# Patient Record
Sex: Female | Born: 1970 | Race: White | Hispanic: No | Smoking: Current every day smoker
Health system: Southern US, Community
[De-identification: ages and names within clinical notes are randomized; demographics above are authoritative.]

---

## 1999-02-13 ENCOUNTER — Other Ambulatory Visit: Admission: RE | Admit: 1999-02-13 | Discharge: 1999-02-13 | Payer: Self-pay | Admitting: *Deleted

## 2000-03-04 ENCOUNTER — Other Ambulatory Visit: Admission: RE | Admit: 2000-03-04 | Discharge: 2000-03-04 | Payer: Self-pay | Admitting: *Deleted

## 2001-04-18 ENCOUNTER — Other Ambulatory Visit: Admission: RE | Admit: 2001-04-18 | Discharge: 2001-04-18 | Payer: Self-pay | Admitting: Obstetrics and Gynecology

## 2002-05-14 ENCOUNTER — Other Ambulatory Visit: Admission: RE | Admit: 2002-05-14 | Discharge: 2002-05-14 | Payer: Self-pay | Admitting: *Deleted

## 2003-09-14 ENCOUNTER — Other Ambulatory Visit: Admission: RE | Admit: 2003-09-14 | Discharge: 2003-09-14 | Payer: Self-pay | Admitting: Obstetrics & Gynecology

## 2004-01-03 ENCOUNTER — Ambulatory Visit (HOSPITAL_COMMUNITY): Admission: RE | Admit: 2004-01-03 | Discharge: 2004-01-03 | Payer: Self-pay | Admitting: Family Medicine

## 2004-12-29 ENCOUNTER — Other Ambulatory Visit: Admission: RE | Admit: 2004-12-29 | Discharge: 2004-12-29 | Payer: Self-pay | Admitting: Obstetrics & Gynecology

## 2008-03-10 ENCOUNTER — Emergency Department (HOSPITAL_COMMUNITY): Admission: EM | Admit: 2008-03-10 | Discharge: 2008-03-10 | Payer: Self-pay | Admitting: Emergency Medicine

## 2010-04-23 ENCOUNTER — Encounter: Payer: Self-pay | Admitting: Internal Medicine

## 2013-04-11 ENCOUNTER — Emergency Department (HOSPITAL_COMMUNITY)
Admission: EM | Admit: 2013-04-11 | Discharge: 2013-04-11 | Disposition: A | Discharge status: Home / Self Care | Payer: 59 | Attending: Emergency Medicine | Admitting: Emergency Medicine

## 2013-04-11 ENCOUNTER — Encounter (HOSPITAL_COMMUNITY): Payer: Self-pay | Admitting: Emergency Medicine

## 2013-04-11 ENCOUNTER — Emergency Department (HOSPITAL_COMMUNITY): Payer: 59

## 2013-04-11 DIAGNOSIS — S0003XA Contusion of scalp, initial encounter: Secondary | ICD-10-CM | POA: Insufficient documentation

## 2013-04-11 DIAGNOSIS — Z3202 Encounter for pregnancy test, result negative: Secondary | ICD-10-CM | POA: Insufficient documentation

## 2013-04-11 DIAGNOSIS — W1809XA Striking against other object with subsequent fall, initial encounter: Secondary | ICD-10-CM | POA: Insufficient documentation

## 2013-04-11 DIAGNOSIS — W010XXA Fall on same level from slipping, tripping and stumbling without subsequent striking against object, initial encounter: Secondary | ICD-10-CM | POA: Insufficient documentation

## 2013-04-11 DIAGNOSIS — S1093XA Contusion of unspecified part of neck, initial encounter: Secondary | ICD-10-CM

## 2013-04-11 DIAGNOSIS — Z79899 Other long term (current) drug therapy: Secondary | ICD-10-CM | POA: Insufficient documentation

## 2013-04-11 DIAGNOSIS — S01511A Laceration without foreign body of lip, initial encounter: Secondary | ICD-10-CM

## 2013-04-11 DIAGNOSIS — S0083XA Contusion of other part of head, initial encounter: Secondary | ICD-10-CM

## 2013-04-11 DIAGNOSIS — Y92009 Unspecified place in unspecified non-institutional (private) residence as the place of occurrence of the external cause: Secondary | ICD-10-CM | POA: Insufficient documentation

## 2013-04-11 DIAGNOSIS — Y9389 Activity, other specified: Secondary | ICD-10-CM | POA: Insufficient documentation

## 2013-04-11 DIAGNOSIS — S01501A Unspecified open wound of lip, initial encounter: Secondary | ICD-10-CM | POA: Insufficient documentation

## 2013-04-11 LAB — POCT PREGNANCY, URINE: Preg Test, Ur: NEGATIVE

## 2013-04-11 MED ORDER — MORPHINE SULFATE 4 MG/ML IJ SOLN
4.0000 mg | Freq: Once | INTRAMUSCULAR | Status: AC
  Administered 2013-04-11: 4 mg via INTRAVENOUS
  Filled 2013-04-11: qty 1

## 2013-04-11 NOTE — ED Provider Notes (Addendum)
CSN: [id number]     Arrival date & time 04/11/13  1306 History   First MD Initiated Contact with Patient 04/11/13 1319     Chief Complaint  Patient presents with  . Fall  . Lip Laceration   (Consider location/radiation/quality/duration/timing/severity/associated sxs/prior Treatment) HPI Comments: Patient is a 43 year old female presenting to the emergency department after a fall this morning around 6 AM. Patient states she was getting up when she tripped over her 2-month-old puppy and landed face first onto the table. Patient denies losing consciousness or vomiting. She is describing 7/10 throbbing pain with swelling to the left side of her face. She does endorse that she had a nosebleed that has stopped. She is also complaining of 2 chipped teeth and a lower lip laceration. Patient states she was initially over at urgent care sooner for evaluation where they sent her over here for further evaluation and likely ENT consultation. No modifying factors. Patient is not on any blood thinners. Tetanus UTD.  Patient is a 43 y.o. female presenting with fall.  Fall Pertinent negatives include no chest pain or fever.    History reviewed. No pertinent past medical history. No past surgical history on file. No family history on file. History  Substance Use Topics  . Smoking status: Not on file  . Smokeless tobacco: Not on file  . Alcohol Use: Not on file   OB History   Grav Para Term Preterm Abortions TAB SAB Ect Mult Living                 Review of Systems  Constitutional: Negative for fever.  HENT: Positive for facial swelling.   Respiratory: Negative for shortness of breath.   Cardiovascular: Negative for chest pain.  Skin: Positive for color change (Bruising) and wound (Lip laceration).  Neurological: Negative for syncope.  All other systems reviewed and are negative.    Allergies  Review of patient's allergies indicates no known allergies.  Home Medications   Current  Outpatient Rx  Name  Route  Sig  Dispense  Refill  . ALPRAZolam (XANAX) 0.5 MG tablet   Oral   Take 0.5 mg by mouth daily as needed for anxiety.         Marland Kitchen levonorgestrel (MIRENA) 20 MCG/24HR IUD   Intrauterine   1 each by Intrauterine route once.         . phentermine 15 MG capsule   Oral   Take 15 mg by mouth daily.          BP 121/81  Pulse 81  Temp(Src) 98.4 F (36.9 C) (Oral)  Resp 18  SpO2 99% Physical Exam  Constitutional: She is oriented to person, place, and time. She appears well-developed and well-nourished. No distress.  HENT:  Head: Normocephalic. Head is with contusion. Head is without raccoon's eyes, without Battle's sign, without right periorbital erythema and without left periorbital erythema.    Right Ear: External ear normal.  Left Ear: External ear normal.  Nose: Sinus tenderness present.  Mouth/Throat: Uvula is midline, oropharynx is clear and moist and mucous membranes are normal. No trismus in the jaw. Lacerations present. No uvula swelling. No oropharyngeal exudate.  Dried blood in left nostril. No active bleeding. 2 cm full thickness lip laceration with involvement through vermillion border with tissue loss.   Eyes: Conjunctivae and EOM are normal. Pupils are equal, round, and reactive to light.  Neck: Normal range of motion. Neck supple.  Cardiovascular: Normal rate, regular rhythm, normal heart sounds and  intact distal pulses.   Pulmonary/Chest: Effort normal and breath sounds normal. No respiratory distress.  Abdominal: Soft. There is no tenderness.  Musculoskeletal: Normal range of motion.  Neurological: She is alert and oriented to person, place, and time. She has normal strength. No cranial nerve deficit or sensory deficit. Gait normal. GCS eye subscore is 4. GCS verbal subscore is 5. GCS motor subscore is 6.  No pronator drift. Bilateral heel-knee-shin intact.  Skin: Skin is warm and dry. She is not diaphoretic.  Psychiatric: She has a  normal mood and affect.    ED Course  Procedures (including critical care time) Medications  morphine 4 MG/ML injection 4 mg (not administered)  morphine 4 MG/ML injection 4 mg (4 mg Intravenous Given 04/11/13 1353)    Labs Review Labs Reviewed  POCT PREGNANCY, URINE   Imaging Review Ct Maxillofacial Wo Cm  04/11/2013   CLINICAL DATA:  [age] year old female with facial injury and pain following fall  EXAM: CT MAXILLOFACIAL WITHOUT CONTRAST  TECHNIQUE: Multidetector CT imaging of the maxillofacial structures was performed. Multiplanar CT image reconstructions were also generated. A small metallic BB was placed on the right temple in order to reliably differentiate right from left.  COMPARISON:  None.  FINDINGS: A laceration of the left lower lip is noted.  There is no evidence of fracture, subluxation or dislocation.  The orbits and globes are unremarkable.  The paranasal sinuses are clear except for small amount of mucus in the left maxillary sinus.  The mastoid air cells and middle/inner ears are clear.  No focal bony lesions are present.  The visualized portions of the brain are unremarkable.  IMPRESSION: Lower lip laceration without acute bony abnormality.   Electronically Signed   By: Laveda AbbeJeff  Hu M.D.   On: 04/11/2013 14:22    EKG Interpretation   None       MDM   1. Laceration of lip, complicated, initial encounter     3:44 PM Trauma ENT consulted and will see patient for full thickness lip laceration.  Afebrile, NAD, non-toxic appearing, AAOx4. No neurofocal deficits on examination. CT scan reviewed. Pain managed in ED. Patient awaiting ENT consultation pending disposition. Will sign patient out to Dr. Bernette MayersSheldon.       Jeannetta EllisJennifer L Slyvia Lartigue, PA-C 04/11/13 1622  Lise AuerJennifer L Sincerity Cedar, PA-C 04/11/13 [phone]

## 2013-04-11 NOTE — Discharge Instructions (Signed)
Soft diet as tolerated. Rinse after each meal, ok to brush teeth. Light Activities and ice to area. Apply vaseline or antibiotic ointment twice daily. Ok to shower. F/u 7-10 days.   Absorbable Suture Repair Absorbable sutures (stitches) hold skin together so you can heal. Keep skin wounds clean and dry for the next 2 to 3 days. Then, you may gently wash your wound and dress it with an antibiotic ointment as recommended. As your wound begins to heal, the sutures are no longer needed, and they typically begin to fall off. This will take 7 to 10 days. After 10 days, if your sutures are loose, you can remove them by wiping with a clean gauze pad or a cotton ball. Do not pull your sutures out. They should wipe away easily. If after 10 days they do not easily wipe away, have your caregiver take them out. Absorbable sutures may be used deep in a wound to help hold it together. If these stitches are below the skin, the body will absorb them completely in 3 to 4 weeks.  You may need a tetanus shot if:  You cannot remember when you had your last tetanus shot.  You have never had a tetanus shot. If you get a tetanus shot, your arm may swell, get red, and feel warm to the touch. This is common and not a problem. If you need a tetanus shot and you choose not to have one, there is a rare chance of getting tetanus. Sickness from tetanus can be serious. SEEK IMMEDIATE MEDICAL CARE IF:  You have redness in the wound area.  The wound area feels hot to the touch.  You develop swelling in the wound area.  You develop pain.  There is fluid drainage from the wound. Document Released: 04/26/2004 Document Revised: 06/11/2011 Document Reviewed: 08/08/2010 [facility] Patient Information 2014 [place], [place].

## 2013-04-11 NOTE — Consult Note (Signed)
Reason for Consult:lip laceration Referring Physician: Florestine Avers Wickline MD  [facility]  Catherine Wall is an 43 y.o. female.  HPI: Suffered fall from standing early this am with lip laceration. No additional complaints.   History reviewed. No pertinent past medical history.  No past surgical history on file.  No family history on file.  Social History: + tobacco daily smoking  Allergies: No Known Allergies  Medications: I have reviewed the patient's current medications.  Results for orders placed during the hospital encounter of 04/11/13 (from the past 48 hour(s))  POCT PREGNANCY, URINE     Status: None   Collection Time    04/11/13  2:20 PM      Result Value Range   Preg Test, Ur NEGATIVE  NEGATIVE   Comment:            THE SENSITIVITY OF THIS     METHODOLOGY IS >24 mIU/mL    Ct Maxillofacial Wo Cm  04/11/2013   CLINICAL DATA:  43 year old female with facial injury and pain following fall  EXAM: CT MAXILLOFACIAL WITHOUT CONTRAST  TECHNIQUE: Multidetector CT imaging of the maxillofacial structures was performed. Multiplanar CT image reconstructions were also generated. A small metallic BB was placed on the right temple in order to reliably differentiate right from left.  COMPARISON:  None.  FINDINGS: A laceration of the left lower lip is noted.  There is no evidence of fracture, subluxation or dislocation.  The orbits and globes are unremarkable.  The paranasal sinuses are clear except for small amount of mucus in the left maxillary sinus.  The mastoid air cells and middle/inner ears are clear.  No focal bony lesions are present.  The visualized portions of the brain are unremarkable.  IMPRESSION: Lower lip laceration without acute bony abnormality.   Electronically Signed   By: Laveda AbbeJeff  Hu M.D.   On: 04/11/2013 14:22    ROS Blood pressure 121/81, pulse 81, temperature 98.4 F (36.9 C), temperature source Oral, resp. rate 18, SpO2 99.00%. Physical Exam Alert  oriented, NAD CN II-XII intact, symmetric No septal hematoma EOMI Left lower lip not involving commissure full thickness laceration  Assessment/Plan: CT personally reviewed. No fractures. Lower lip laceration full thickness. Plan repair in ED.  Soft diet as tolerated. Rinse after each meal, ok to brush teeth. Light  Activities and ice to area. Apply vaseline or antibiotic ointment twice daily. Ok to shower. F/u 7-10 days. Contact information provided.  PROCEDURE NOTE: Diagnosis: laceration full thickness left lower lip Procedure:  Repair lower lip full thickness, less than 1/2 vertical height lip  Left mental nerve block performed with 1% lidocaine with epi 1:100K. Prepped with Betadine. Sharp excision of all wound edges completed with scissors. 4-0 vicryl used to repair muscle in interrupted fashion. Vermillion aligned with simple 5- chromic. Intraoral mucosa repaired with running 4-0 vicryl. Wet-dry vermillion approximated with 5-0 chromic. Remainder of vermillion repaired with 5-0 chromic. Tolerated well.  Catherine FellowsBrinda Eitan Doubleday, MD [facility] Plastic [facility] Re[facility] [phone]

## 2013-04-11 NOTE — ED Notes (Signed)
Patient transported to CT 

## 2013-04-11 NOTE — ED Notes (Signed)
MD at bedside. 

## 2013-04-11 NOTE — ED Notes (Signed)
Dr. Wickline at bedside.  

## 2013-04-11 NOTE — ED Notes (Signed)
Pt from home, c/o fall with lip laceration. Bleeding controled pta. Pt has swelling to left side of face, dried blood at left nostril. Laceration is full thickness. Pt alert and oriented, denies loc.

## 2013-04-11 NOTE — ED Provider Notes (Signed)
Medical screening examination/treatment/procedure(s) were conducted as a shared visit with non-physician practitioner(s) and myself.  I personally evaluated the patient during the encounter.  EKG Interpretation   None        Pt with large lip laceration s/p fall ENT has been consulted Pt stable in the ED   Joya Gaskinsonald W Genoa Freyre, MD 04/11/13 1627

## 2013-04-11 NOTE — ED Provider Notes (Signed)
  Physical Exam  BP 121/81  Pulse 81  Temp(Src) 98.4 F (36.9 C) (Oral)  Resp 18  SpO2 99%  Physical Exam  ED Course  Procedures  MDM  Tripped over dog earlier today, struck her face on the kitchen table, now with bruising on L cheek, 2-3 cm laceration on lip. CT face sig only for contusion, lip laceration. ENT contacted, will evaluate, will fix lip laceration. Tetanus updated. Disposition pending ENT.   ENT evaluated, sutured patient, will follow-up in 7-10 days. Patient discharged with laceration care instructions. Will follow-up with [facility] as needed. Patient seen and evaluated by myself and my attending, Dr. Bernette MayersSheldon.        Imagene ShellerSteve Chandlar Guice, MD 04/12/13 [phone]

## 2013-04-12 NOTE — ED Provider Notes (Signed)
Medical screening examination/treatment/procedure(s) were conducted as a shared visit with non-physician practitioner(s) and myself.  I personally evaluated the patient during the encounter.  EKG Interpretation   None        Pt stable in the ED, large lip laceration noted, ENT consulted  Joya Gaskinsonald W Azarel Banner, MD 04/12/13 [phone]

## 2014-08-23 IMAGING — CT CT MAXILLOFACIAL W/O CM
3 series · 16 of 47 positions shown, 19 images · non-contrast
Comparison: None.

CLINICAL DATA: 42-year-old female with facial injury and pain
following fall

EXAM:
CT MAXILLOFACIAL WITHOUT CONTRAST
TECHNIQUE: Multidetector CT imaging of the maxillofacial structures was
performed. Multiplanar CT image reconstructions were also generated.
A small metallic BB was placed on the right temple in order to
reliably differentiate right from left.

[Series 2: facial/ orbits 2.0 h30s · axial · 0.34mm/px · z∈[+292,+434]mm · 10 of 83 slices shown, 13 images]
[im 6/83  brain]
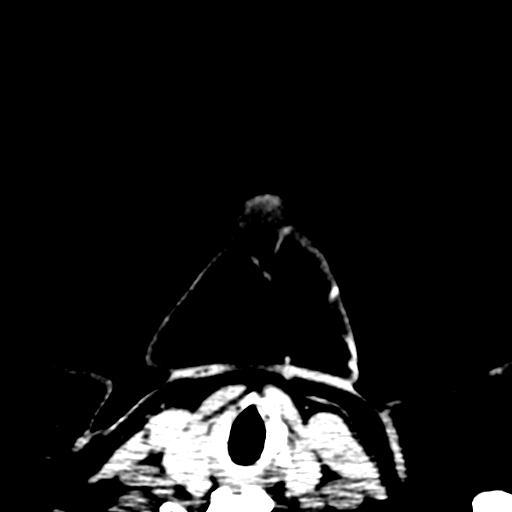
[im 6/83  bone]
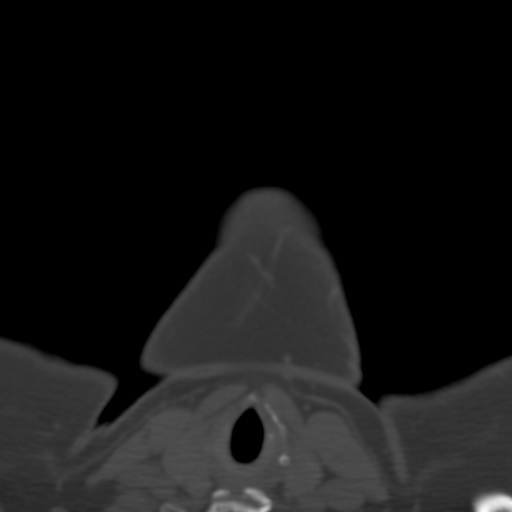
[im 15/83  bone]
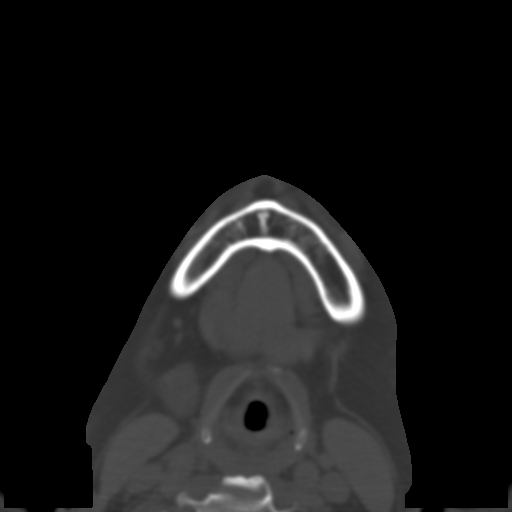
[im 23/83  bone]
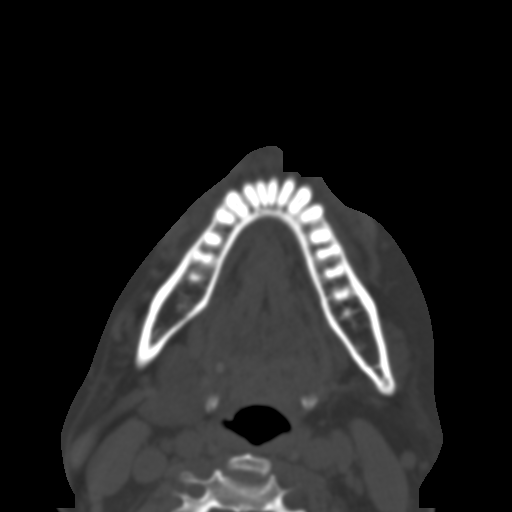
[im 29/83  bone]
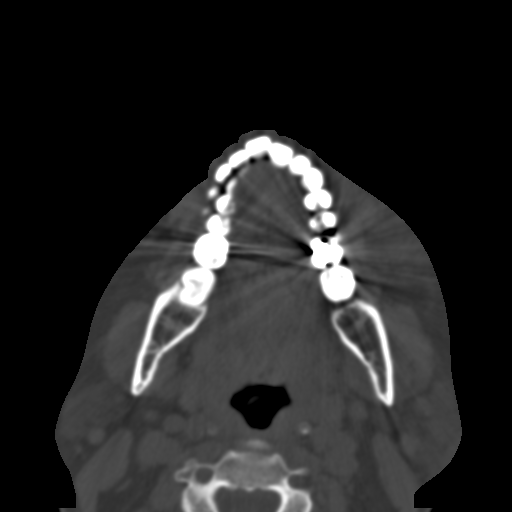
[im 37/83  brain]
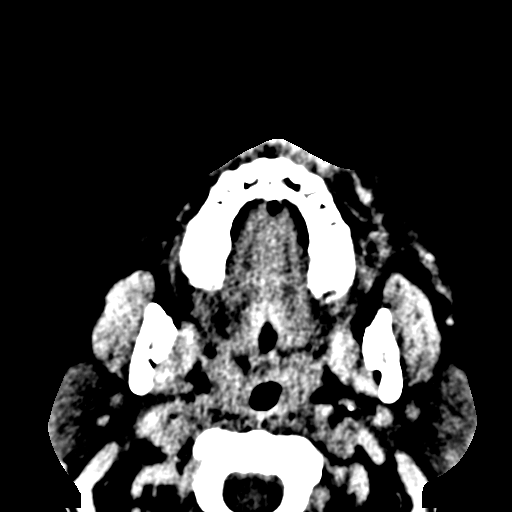
[im 37/83  bone]
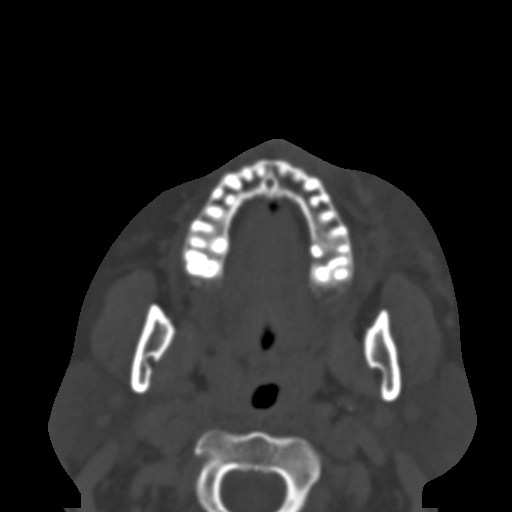
[im 46/83  bone]
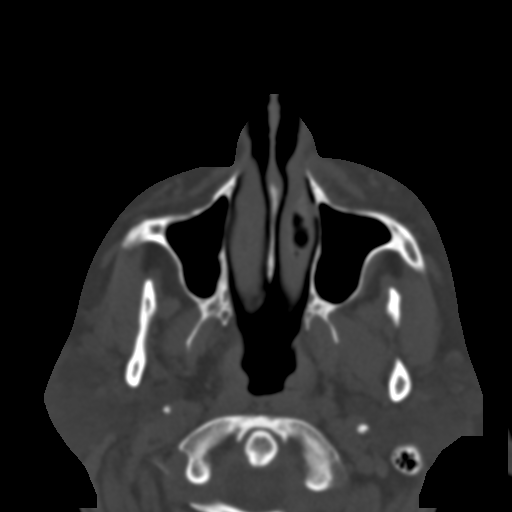
[im 54/83  bone]
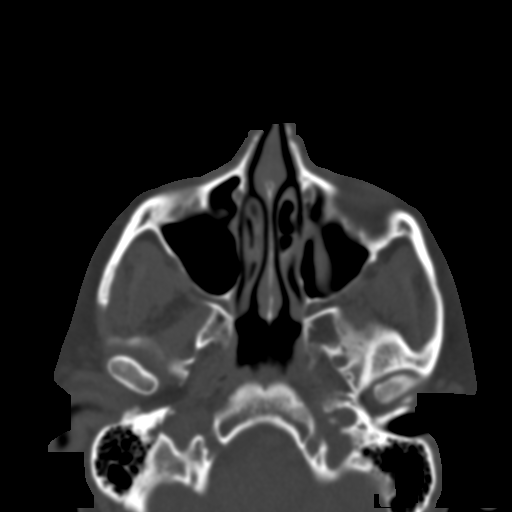
[im 63/83  bone]
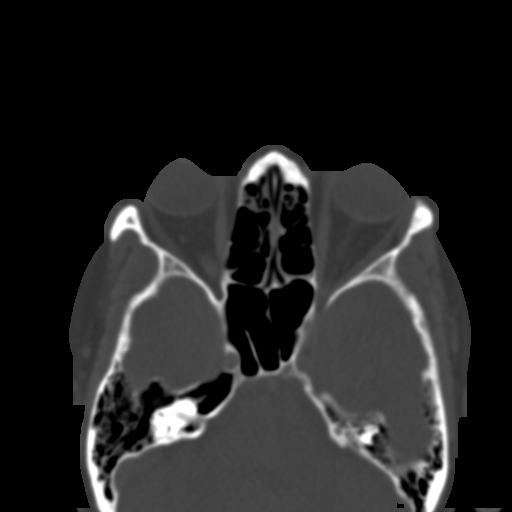
[im 68/83  brain]
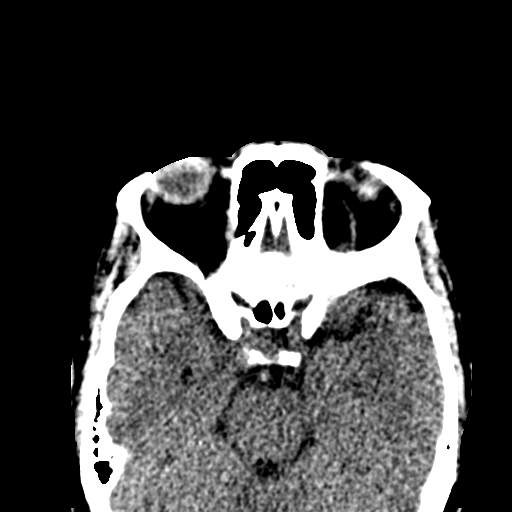
[im 68/83  bone]
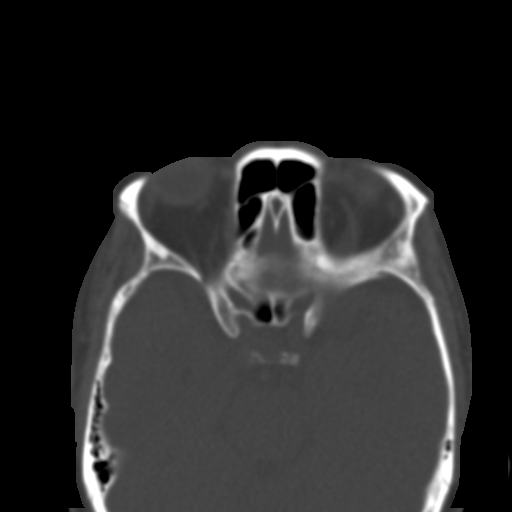
[im 77/83  bone]
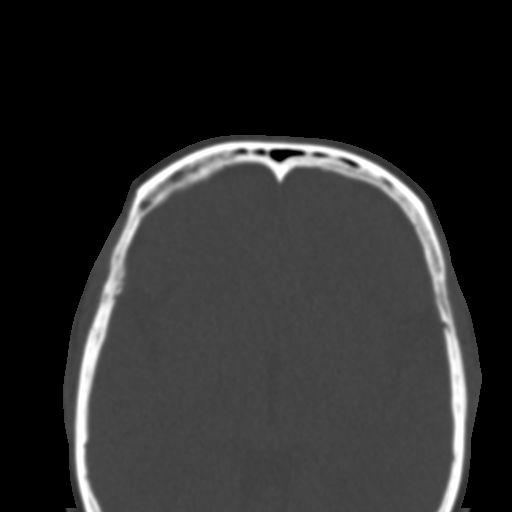

[Series 4: coronal st · coronal · 0.32mm/px · 3 of 71 slices shown]
[im 24/71  bone]
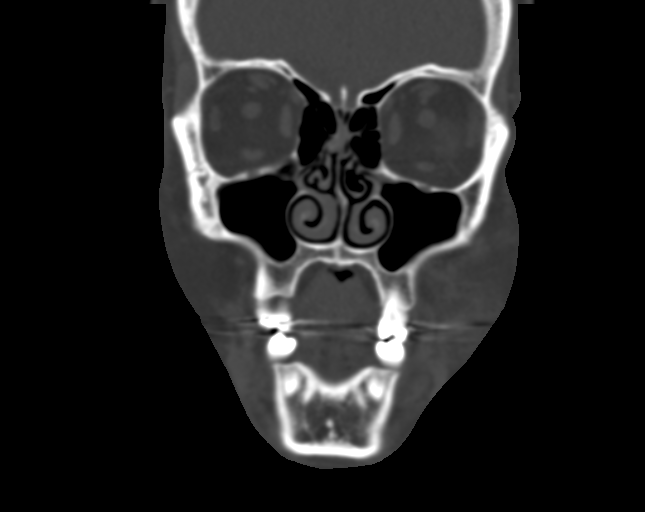
[im 32/71  bone]
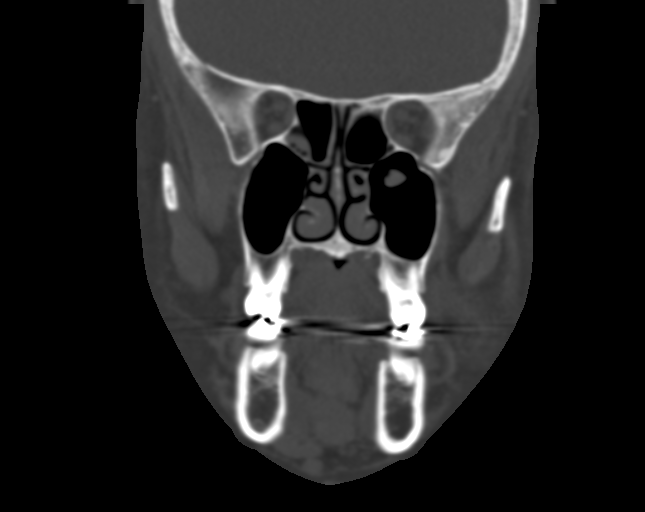
[im 39/71  bone]
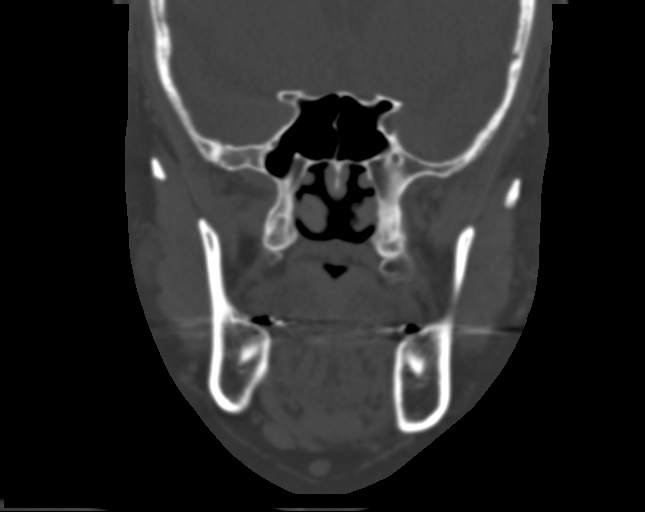

[Series 5: sagittal st · sagittal · 0.32mm/px · 3 of 76 slices shown]
[im 26/76  bone]
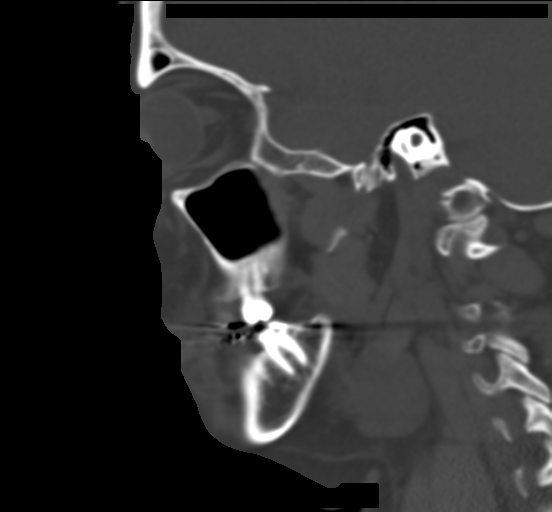
[im 38/76  bone]
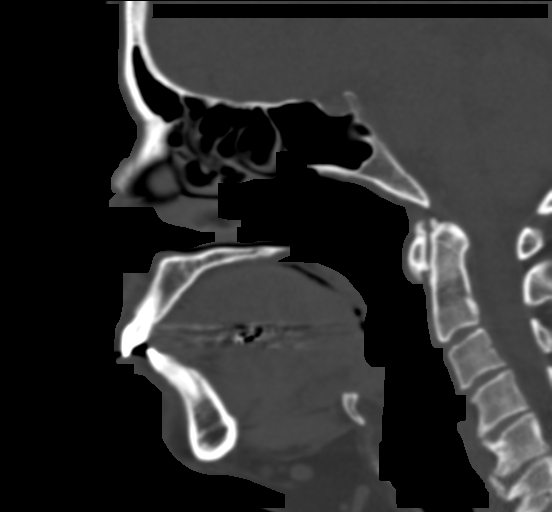
[im 51/76  bone]
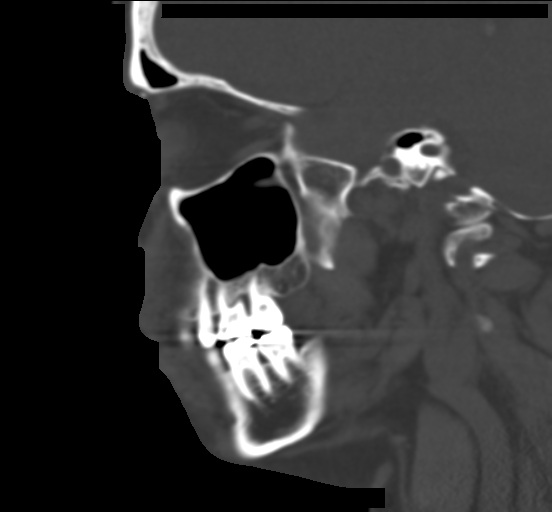

[16 of 47 positions shown; findings below may reference images not displayed]

FINDINGS: A laceration of the left lower lip is noted.

There is no evidence of fracture, subluxation or dislocation.

The orbits and globes are unremarkable.

The paranasal sinuses are clear except for small amount of mucus in
the left maxillary sinus.

The mastoid air cells and middle/inner ears are clear.

No focal bony lesions are present.

The visualized portions of the brain are unremarkable.
IMPRESSION: Lower lip laceration without acute bony abnormality.

## 2019-04-13 ENCOUNTER — Ambulatory Visit: Payer: Self-pay | Attending: Internal Medicine

## 2020-10-25 ENCOUNTER — Ambulatory Visit
Admission: EM | Admit: 2020-10-25 | Discharge: 2020-10-25 | Disposition: A | Discharge status: Home / Self Care | Payer: BC Managed Care – PPO | Attending: Family Medicine | Admitting: Family Medicine

## 2020-10-25 DIAGNOSIS — N3090 Cystitis, unspecified without hematuria: Secondary | ICD-10-CM | POA: Diagnosis present

## 2020-10-25 LAB — POCT URINALYSIS DIP (MANUAL ENTRY)
Glucose, UA: 100 mg/dL — AB
Nitrite, UA: POSITIVE — AB
Protein Ur, POC: 100 mg/dL — AB
Spec Grav, UA: 1.02 (ref 1.010–1.025)
Urobilinogen, UA: 4 E.U./dL — AB
pH, UA: 5 (ref 5.0–8.0)

## 2020-10-25 MED ORDER — CEPHALEXIN 500 MG PO CAPS: 500.0000 mg | ORAL_CAPSULE | Freq: Two times a day (BID) | ORAL | 0 refills | Status: DC

## 2020-10-25 NOTE — ED Triage Notes (Signed)
Pt presents with dysuria for past few days

## 2020-10-25 NOTE — ED Provider Notes (Signed)
[facility]    ASSESSMENT & PLAN:  1. Cystitis    Labs Reviewed  POCT URINALYSIS DIP (MANUAL ENTRY) - Abnormal; Notable for the following components:      Result Value   Color, UA orange (*)    Glucose, UA =100 (*)    Bilirubin, UA small (*)    Ketones, POC UA trace (5) (*)    Blood, UA small (*)    Protein Ur, POC =100 (*)    Urobilinogen, UA 4.0 (*)    Nitrite, UA Positive (*)    Leukocytes, UA Large (3+) (*)    All other components within normal limits  URINE CULTURE   Begin: Meds ordered this encounter  Medications   cephALEXin (KEFLEX) 500 MG capsule    Sig: Take 1 capsule (500 mg total) by mouth 2 (two) times daily.    Dispense:  10 capsule    Refill:  0    No signs of pyelonephritis. Discussed. Urine culture sent.  Will follow up with her PCP or here if not showing improvement over the next 48 hours, sooner if needed.  Outlined signs and symptoms indicating need for more acute intervention. Patient verbalized understanding. After Visit Summary given.  SUBJECTIVE:  Catherine Wall is a 50 y.o. female who complains of urinary frequency, urgency and dysuria for the past 3 d. Without associated flank pain, fever, chills, vaginal discharge or bleeding. Gross hematuria: not present. No specific aggravating or alleviating factors reported. No LE edema. Normal PO intake without n/v/d. Without specific abdominal pain. Ambulatory without difficulty. OTC treatment: AZO with some relief. H/O UTI: very distant.  LMP: No LMP recorded. (Menstrual status: IUD).   OBJECTIVE:  Vitals:   10/25/20 1751  BP: 132/89  Pulse: 94  Resp: 18  Temp: 98 F (36.7 C)  SpO2: 98%   General appearance: alert; no distress HENT: oropharynx Lungs: unlabored respirations Extremities: no edema; symmetrical with no gross deformities Skin: warm and dry Neurologic: normal gait Psychological: alert and cooperative; normal mood and affect  Labs Reviewed  POCT URINALYSIS  DIP (MANUAL ENTRY) - Abnormal; Notable for the following components:      Result Value   Color, UA orange (*)    Glucose, UA =100 (*)    Bilirubin, UA small (*)    Ketones, POC UA trace (5) (*)    Blood, UA small (*)    Protein Ur, POC =100 (*)    Urobilinogen, UA 4.0 (*)    Nitrite, UA Positive (*)    Leukocytes, UA Large (3+) (*)    All other components within normal limits  URINE CULTURE    No Known Allergies  History reviewed. No pertinent past medical history. Social History   Socioeconomic History   Marital status: Divorced    Spouse name: Not on file   Number of children: Not on file   Years of education: Not on file   Highest education level: Not on file  Occupational History   Not on file  Tobacco Use   Smoking status: Every Day    Types: Cigarettes   Smokeless tobacco: Never  Substance and Sexual Activity   Alcohol use: Not on file   Drug use: Not on file   Sexual activity: Not on file  Other Topics Concern   Not on file  Social History Narrative   Not on file   Social Determinants of Health   Financial Resource Strain: Not on file  Food Insecurity: Not on file  Transportation Needs: Not on file  Physical Activity: Not on file  Stress: Not on file  Social Connections: Not on file  Intimate Partner Violence: Not on file   History reviewed. No pertinent family history.      Mardella Layman, MD 10/25/20 [phone]

## 2020-10-27 LAB — URINE CULTURE

## 2020-11-03 ENCOUNTER — Ambulatory Visit
Admission: RE | Admit: 2020-11-03 | Discharge: 2020-11-03 | Disposition: A | Discharge status: Home / Self Care | Payer: BC Managed Care – PPO | Source: Ambulatory Visit | Attending: Emergency Medicine | Admitting: Emergency Medicine

## 2020-11-03 ENCOUNTER — Other Ambulatory Visit: Payer: Self-pay

## 2020-11-03 VITALS — BP 121/80 | HR 79 | Temp 98.7°F | Resp 16

## 2020-11-03 DIAGNOSIS — R300 Dysuria: Secondary | ICD-10-CM | POA: Diagnosis present

## 2020-11-03 LAB — POCT URINALYSIS DIP (MANUAL ENTRY)
Bilirubin, UA: NEGATIVE
Glucose, UA: NEGATIVE mg/dL
Ketones, POC UA: NEGATIVE mg/dL
Leukocytes, UA: NEGATIVE
Nitrite, UA: NEGATIVE
Protein Ur, POC: NEGATIVE mg/dL
Spec Grav, UA: 1.025 (ref 1.010–1.025)
Urobilinogen, UA: 0.2 E.U./dL
pH, UA: 5 (ref 5.0–8.0)

## 2020-11-03 MED ORDER — PHENAZOPYRIDINE HCL 200 MG PO TABS: 200.0000 mg | ORAL_TABLET | Freq: Three times a day (TID) | ORAL | 0 refills | Status: AC

## 2020-11-03 NOTE — ED Triage Notes (Signed)
Pt returns for continues dysuria, was advised to come in for recollection

## 2020-11-03 NOTE — Discharge Instructions (Addendum)
Urine without obvious signs of infection today Urine culture sent.  We will call you with abnormal results.   Push fluids and get plenty of rest.   Take pyridium as prescribed and as needed for symptomatic relief Follow up with PCP/ gyn for further evaluation and management Return here or go to ER if you have any new or worsening symptoms such as fever, worsening abdominal pain, nausea/vomiting, flank pain, etc..Marland Kitchen

## 2020-11-03 NOTE — ED Provider Notes (Signed)
[facility]   CC: Burning with urination  SUBJECTIVE:  Catherine Wall is a 50 y.o. female who complains of dysuria and cramping x few weeks.  Patient denies a precipitating event, recent sexual encounter, excessive caffeine intake.  Was seen for similar and prescribed keflex.  Denies relief in symptoms.  Also instructed to have urine recollected.  Denies relief in symptoms.  Admits to similar symptoms in the past.  Denies fever, chills, nausea, vomiting, abdominal pain, flank pain, abnormal vaginal discharge or bleeding, hematuria.    LMP: No LMP recorded. (Menstrual status: IUD).  ROS: As in HPI.  All other pertinent ROS negative.     No past medical history on file. No past surgical history on file. No Known Allergies No current facility-administered medications on file prior to encounter.   Current Outpatient Medications on File Prior to Encounter  Medication Sig Dispense Refill   ALPRAZolam (XANAX) 0.5 MG tablet Take 0.5 mg by mouth daily as needed for anxiety.     levonorgestrel (MIRENA) 20 MCG/24HR IUD 1 each by Intrauterine route once.     phentermine 15 MG capsule Take 15 mg by mouth daily.     Social History   Socioeconomic History   Marital status: Divorced    Spouse name: Not on file   Number of children: Not on file   Years of education: Not on file   Highest education level: Not on file  Occupational History   Not on file  Tobacco Use   Smoking status: Every Day    Types: Cigarettes   Smokeless tobacco: Never  Substance and Sexual Activity   Alcohol use: Not on file   Drug use: Not on file   Sexual activity: Not on file  Other Topics Concern   Not on file  Social History Narrative   Not on file   Social Determinants of Health   Financial Resource Strain: Not on file  Food Insecurity: Not on file  Transportation Needs: Not on file  Physical Activity: Not on file  Stress: Not on file  Social Connections: Not on file  Intimate Partner  Violence: Not on file   No family history on file.  OBJECTIVE:  Vitals:   11/03/20 1148  BP: 121/80  Pulse: 79  Resp: 16  Temp: 98.7 F (37.1 C)  TempSrc: Oral  SpO2: 94%    General appearance: AOx3 in no acute distress HEENT: NCAT.  Oropharynx clear.  Lungs: clear to auscultation bilaterally without adventitious breath sounds Heart: regular rate and rhythm.   Abdomen: soft; non-distended; no tenderness; bowel sounds present; no guarding Back: no CVA tenderness Extremities: no edema; symmetrical with no gross deformities Skin: warm and dry Neurologic: Ambulates from chair to exam table without difficulty Psychological: alert and cooperative; normal mood and affect  Labs Reviewed  POCT URINALYSIS DIP (MANUAL ENTRY) - Abnormal; Notable for the following components:      Result Value   Blood, UA small (*)    All other components within normal limits  URINE CULTURE    ASSESSMENT & PLAN:  1. Dysuria     Meds ordered this encounter  Medications   phenazopyridine (PYRIDIUM) 200 MG tablet    Sig: Take 1 tablet (200 mg total) by mouth 3 (three) times daily.    Dispense:  30 tablet    Refill:  0    Order Specific Question:   Supervising Provider    Answer:   Eustace Moore [[id number]]    Urine  without obvious signs of infection today Urine culture sent.  We will call you with abnormal results.   Push fluids and get plenty of rest.   Take pyridium as prescribed and as needed for symptomatic relief Follow up with PCP/ gyn for further evaluation and management Return here or go to ER if you have any new or worsening symptoms such as fever, worsening abdominal pain, nausea/vomiting, flank pain, etc...  Outlined signs and symptoms indicating need for more acute intervention. Patient verbalized understanding. After Visit Summary given.      Rennis Harding, PA-C 11/03/20 1227

## 2020-11-06 LAB — URINE CULTURE: Culture: 10000 — AB

## 2020-11-07 ENCOUNTER — Telehealth (HOSPITAL_COMMUNITY): Payer: Self-pay | Admitting: Emergency Medicine

## 2020-11-07 MED ORDER — CIPROFLOXACIN HCL 500 MG PO TABS: 500.0000 mg | ORAL_TABLET | Freq: Two times a day (BID) | ORAL | 0 refills | Status: AC

## 2021-04-12 ENCOUNTER — Ambulatory Visit
Admission: EM | Admit: 2021-04-12 | Discharge: 2021-04-12 | Disposition: A | Discharge status: Home / Self Care | Payer: BC Managed Care – PPO | Attending: Urgent Care | Admitting: Urgent Care

## 2021-04-12 ENCOUNTER — Other Ambulatory Visit: Payer: Self-pay

## 2021-04-12 ENCOUNTER — Encounter: Payer: Self-pay | Admitting: Emergency Medicine

## 2021-04-12 DIAGNOSIS — R051 Acute cough: Secondary | ICD-10-CM | POA: Diagnosis not present

## 2021-04-12 DIAGNOSIS — B349 Viral infection, unspecified: Secondary | ICD-10-CM | POA: Diagnosis not present

## 2021-04-12 DIAGNOSIS — Z20822 Contact with and (suspected) exposure to covid-19: Secondary | ICD-10-CM | POA: Diagnosis not present

## 2021-04-12 MED ORDER — PROMETHAZINE-DM 6.25-15 MG/5ML PO SYRP: 5.0000 mL | ORAL_SOLUTION | Freq: Every evening | ORAL | 0 refills | Status: AC | PRN

## 2021-04-12 MED ORDER — BENZONATATE 100 MG PO CAPS: 100.0000 mg | ORAL_CAPSULE | Freq: Three times a day (TID) | ORAL | 0 refills | Status: AC | PRN

## 2021-04-12 MED ORDER — CETIRIZINE HCL 10 MG PO TABS: 10.0000 mg | ORAL_TABLET | Freq: Every day | ORAL | 0 refills | Status: AC

## 2021-04-12 NOTE — ED Triage Notes (Signed)
Pt reports runny nose, ear pain, body aches, headache, intermittent dizziness, fatigue, cough, sore throat, facial pressure since yesterday. Pt reports son tested positive on Monday and pt had negative home test x2 days ago.

## 2021-04-12 NOTE — ED Provider Notes (Signed)
[facility]   MRN: [id number] DOB: Oct 14, 1970  Subjective:   Catherine Wall is a 51 y.o. female presenting for 2-day history of acute onset runny and stuffy nose, ear pain, body aches, coughing, fatigue, intermittent dizziness.  Patient did have very close exposure to her son who tested positive for COVID-19.  At the time she did not have symptoms but tested herself and was negative.  She is a smoker, is deciding to quit from possibility of having COVID-19.  No chest pain, shortness of breath or wheezing.  Patient would be interested in COVID antiviral medication.  No recent history of a creatinine level.  No history of CKD.  No current facility-administered medications for this encounter.  Current Outpatient Medications:    ALPRAZolam (XANAX) 0.5 MG tablet, Take 0.5 mg by mouth daily as needed for anxiety., Disp: , Rfl:    ciprofloxacin (CIPRO) 500 MG tablet, Take 1 tablet (500 mg total) by mouth every 12 (twelve) hours., Disp: 10 tablet, Rfl: 0   levonorgestrel (MIRENA) 20 MCG/24HR IUD, 1 each by Intrauterine route once., Disp: , Rfl:    phenazopyridine (PYRIDIUM) 200 MG tablet, Take 1 tablet (200 mg total) by mouth 3 (three) times daily., Disp: 30 tablet, Rfl: 0   phentermine 15 MG capsule, Take 15 mg by mouth daily., Disp: , Rfl:    No Known Allergies  History reviewed. No pertinent past medical history.   History reviewed. No pertinent surgical history.  History reviewed. No pertinent family history.  Social History   Tobacco Use   Smoking status: Every Day    Types: Cigarettes   Smokeless tobacco: Never    ROS   Objective:   Vitals: BP 125/83 (BP Location: Right Arm)    Pulse 97    Temp 98.5 F (36.9 C) (Oral)    Resp 18    Ht 5\' 4"  (1.626 m)    Wt 230 lb (104.3 kg)    SpO2 96%    BMI 39.48 kg/m   Physical Exam Constitutional:      General: She is not in acute distress.    Appearance: Normal appearance. She is well-developed. She is not  ill-appearing, toxic-appearing or diaphoretic.  HENT:     Head: Normocephalic and atraumatic.     Nose: Nose normal.     Mouth/Throat:     Mouth: Mucous membranes are moist.  Eyes:     Extraocular Movements: Extraocular movements intact.     Conjunctiva/sclera: Conjunctivae normal.  Cardiovascular:     Rate and Rhythm: Normal rate and regular rhythm.     Pulses: Normal pulses.     Heart sounds: Normal heart sounds. No murmur heard.   No friction rub. No gallop.  Pulmonary:     Effort: Pulmonary effort is normal. No respiratory distress.     Breath sounds: Normal breath sounds. No stridor. No wheezing, rhonchi or rales.  Skin:    General: Skin is warm and dry.     Findings: No rash.  Neurological:     Mental Status: She is alert and oriented to person, place, and time.  Psychiatric:        Mood and Affect: Mood normal.        Behavior: Behavior normal.        Thought Content: Thought content normal.    Assessment and Plan :   PDMP not reviewed this encounter.  1. Acute viral syndrome   2. Acute cough   3. Close exposure to COVID-19 virus  4. Morbid obesity (HCC)    Labs pending, high suspicion for COVID-19 given her symptoms and exposure.  Patient would be a good candidate to receive COVID antivirals. Deferred imaging given clear cardiopulmonary exam, hemodynamically stable vital signs.  Recommended use supportive care.  Counseled patient on potential for adverse effects with medications prescribed/recommended today, ER and return-to-clinic precautions discussed, patient verbalized understanding.    Wallis Bamberg, [place] 04/12/21 1530

## 2021-04-13 ENCOUNTER — Telehealth (HOSPITAL_COMMUNITY): Payer: Self-pay | Admitting: Emergency Medicine

## 2021-04-13 LAB — COMPREHENSIVE METABOLIC PANEL
ALT: 17 IU/L (ref 0–32)
AST: 24 IU/L (ref 0–40)
Albumin/Globulin Ratio: 1.7 (ref 1.2–2.2)
Albumin: 4.4 g/dL (ref 3.8–4.9)
Alkaline Phosphatase: 129 IU/L — ABNORMAL HIGH (ref 44–121)
BUN/Creatinine Ratio: 17 (ref 9–23)
BUN: 12 mg/dL (ref 6–24)
Bilirubin Total: 0.2 mg/dL (ref 0.0–1.2)
CO2: 18 mmol/L — ABNORMAL LOW (ref 20–29)
Calcium: 9 mg/dL (ref 8.7–10.2)
Chloride: 102 mmol/L (ref 96–106)
Creatinine, Ser: 0.7 mg/dL (ref 0.57–1.00)
Globulin, Total: 2.6 g/dL (ref 1.5–4.5)
Glucose: 98 mg/dL (ref 70–99)
Potassium: 4.6 mmol/L (ref 3.5–5.2)
Sodium: 137 mmol/L (ref 134–144)
Total Protein: 7 g/dL (ref 6.0–8.5)
eGFR: 105 mL/min/{1.73_m2} (ref >59)

## 2021-04-13 LAB — COVID-19, FLU A+B NAA
Influenza A, NAA: NOT DETECTED
Influenza B, NAA: NOT DETECTED
SARS-CoV-2, NAA: DETECTED — AB

## 2021-04-13 MED ORDER — NIRMATRELVIR/RITONAVIR (PAXLOVID)TABLET: 3.0000 | ORAL_TABLET | Freq: Two times a day (BID) | ORAL | 0 refills | Status: AC

## 2023-07-11 ENCOUNTER — Other Ambulatory Visit: Payer: Self-pay

## 2023-07-11 ENCOUNTER — Ambulatory Visit
Admission: EM | Admit: 2023-07-11 | Discharge: 2023-07-11 | Disposition: A | Discharge status: Home / Self Care | Attending: Family Medicine | Admitting: Family Medicine

## 2023-07-11 DIAGNOSIS — R079 Chest pain, unspecified: Secondary | ICD-10-CM

## 2023-07-11 MED ORDER — TIZANIDINE HCL 4 MG PO CAPS: 4.0000 mg | ORAL_CAPSULE | Freq: Three times a day (TID) | ORAL | 0 refills | Status: AC | PRN

## 2023-07-11 NOTE — Discharge Instructions (Signed)
 Your workup has been reassuring today.  I have sent over a muscle relaxer in case your chest pain is muscular in nature.  You may also try heat, massage, muscle rubs, over-the-counter pain relievers as needed.  Go to the emergency department for any worsening or unresolving symptoms and follow-up with your primary care provider soon as possible.

## 2023-07-11 NOTE — ED Provider Notes (Signed)
 [facility]    CSN: [id number] Arrival date & time: 07/11/23  1439      History   Chief Complaint No chief complaint on file.   HPI Catherine Wall is a 53 y.o. female.   Patient presenting today with 5-day history of diffuse chest discomfort wrapping around to her back and rib region anteriorly.  Seems to be better throughout the day and worse at night.  Denies shortness of breath, wheezing, dizziness, known injury apart from a large sneezing fit just before onset of symptoms.  Does note that she feels her heart fluttering at night but not currently having the symptoms.  So far trying some aspirin with minimal relief.    History reviewed. No pertinent past medical history.  There are no active problems to display for this patient.   History reviewed. No pertinent surgical history.  OB History   No obstetric history on file.      Home Medications    Prior to Admission medications   Medication Sig Start Date End Date Taking? Authorizing Provider  tiZANidine (ZANAFLEX) 4 MG capsule Take 1 capsule (4 mg total) by mouth 3 (three) times daily as needed for muscle spasms. Do not drink alcohol or drive while taking this medication.  May cause drowsiness. 07/11/23  Yes Particia Nearing, PA-C  ALPRAZolam Prudy Feeler) 0.5 MG tablet Take 0.5 mg by mouth daily as needed for anxiety.    [provider]  benzonatate (TESSALON) 100 MG capsule Take 1-2 capsules (100-200 mg total) by mouth 3 (three) times daily as needed for cough. 04/12/21   Wallis Bamberg, PA-C  cetirizine (ZYRTEC ALLERGY) 10 MG tablet Take 1 tablet (10 mg total) by mouth daily. 04/12/21   Wallis Bamberg, PA-C  ciprofloxacin (CIPRO) 500 MG tablet Take 1 tablet (500 mg total) by mouth every 12 (twelve) hours. 11/07/20   Merrilee Jansky, MD  levonorgestrel (MIRENA) 20 MCG/24HR IUD 1 each by Intrauterine route once.    [provider]  phenazopyridine (PYRIDIUM) 200 MG tablet Take 1 tablet (200 mg  total) by mouth 3 (three) times daily. 11/03/20   [place], [place], PA-C  phentermine 15 MG capsule Take 15 mg by mouth daily.    [provider]  promethazine-dextromethorphan (PROMETHAZINE-DM) 6.25-15 MG/5ML syrup Take 5 mLs by mouth at bedtime as needed for cough. 04/12/21   Wallis Bamberg, PA-C    Family History History reviewed. No pertinent family history.  Social History Social History   Tobacco Use   Smoking status: Every Day    Types: Cigarettes   Smokeless tobacco: Never     Allergies   Codeine   Review of Systems Review of Systems Per HPI  Physical Exam Triage Vital Signs ED Triage Vitals  Encounter Vitals Group     BP 07/11/23 1449 123/85     Systolic BP Percentile --      Diastolic BP Percentile --      Pulse Rate 07/11/23 1449 90     Resp 07/11/23 1449 18     Temp 07/11/23 1449 98.2 F (36.8 C)     Temp Source 07/11/23 1449 Oral     SpO2 07/11/23 1449 95 %     Weight --      Height --      Head Circumference --      Peak Flow --      Pain Score 07/11/23 1451 5     Pain Loc --      Pain Education --  Exclude from Growth Chart --    No data found.  Updated Vital Signs BP 123/85 (BP Location: Right Arm)   Pulse 90   Temp 98.2 F (36.8 C) (Oral)   Resp 18   LMP  (LMP Unknown)   SpO2 95%   Visual Acuity Right Eye Distance:   Left Eye Distance:   Bilateral Distance:    Right Eye Near:   Left Eye Near:    Bilateral Near:     Physical Exam Vitals and nursing note reviewed.  Constitutional:      Appearance: Normal appearance. She is not ill-appearing.  HENT:     Head: Atraumatic.  Eyes:     Extraocular Movements: Extraocular movements intact.     Conjunctiva/sclera: Conjunctivae normal.  Cardiovascular:     Rate and Rhythm: Normal rate and regular rhythm.     Heart sounds: Normal heart sounds.  Pulmonary:     Effort: Pulmonary effort is normal.     Breath sounds: Normal breath sounds. No wheezing or rales.     Comments:  Mild chest wall tenderness to palpation to the center and right chest wall Musculoskeletal:        General: Normal range of motion.     Cervical back: Normal range of motion and neck supple.  Skin:    General: Skin is warm and dry.  Neurological:     Mental Status: She is alert and oriented to person, place, and time.  Psychiatric:        Mood and Affect: Mood normal.        Thought Content: Thought content normal.        Judgment: Judgment normal.      UC Treatments / Results  Labs (all labs ordered are listed, but only abnormal results are displayed) Labs Reviewed - No data to display  EKG   Radiology No results found.  Procedures Procedures (including critical care time)  Medications Ordered in UC Medications - No data to display  Initial Impression / Assessment and Plan / UC Course  I have reviewed the triage vital signs and the nursing notes.  Pertinent labs & imaging results that were available during my care of the patient were reviewed by me and considered in my medical decision making (see chart for details).     Vital signs within normal limits today, she is well-appearing in no acute distress.  EKG today showing normal sinus rhythm at 74 bpm with right bundle branch block and no acute ST or T wave changes.  No prior EKGs for comparison.  She declines chest x-ray for further evaluation.  Suspect muscular etiology, however did discuss at length that we are unable to further rule out cardiac or more emergent pulmonary conditions at this time.  Discussed heat, Zanaflex, massage, muscle rubs, over-the-counter pain relievers and ED for worsening symptoms.  Final Clinical Impressions(s) / UC Diagnoses   Final diagnoses:  Chest pain, unspecified type     Discharge Instructions      Your workup has been reassuring today.  I have sent over a muscle relaxer in case your chest pain is muscular in nature.  You may also try heat, massage, muscle rubs, over-the-counter  pain relievers as needed.  Go to the emergency department for any worsening or unresolving symptoms and follow-up with your primary care provider soon as possible.    ED Prescriptions     Medication Sig Dispense Auth. Provider   tiZANidine (ZANAFLEX) 4 MG capsule Take 1 capsule (  4 mg total) by mouth 3 (three) times daily as needed for muscle spasms. Do not drink alcohol or drive while taking this medication.  May cause drowsiness. 15 capsule Particia Nearing, [place]      PDMP not reviewed this encounter.   Particia Nearing, [place] 07/11/23 [phone]

## 2023-07-11 NOTE — ED Triage Notes (Signed)
 Pt reports she has some  mid chest discomfort that radiates to her mid back and down ow to her sides x 5 days. States she has been sneezing a lot   Feels like her heart is fluttering at times especially at night

## 2024-09-14 ENCOUNTER — Ambulatory Visit: Admitting: Physician Assistant
